# Patient Record
Sex: Female | Born: 1975 | Hispanic: No | Marital: Single | Smoking: Never smoker
Health system: Southern US, Community
[De-identification: ages and names within clinical notes are randomized; demographics above are authoritative.]

## PROBLEM LIST (undated history)

## (undated) DIAGNOSIS — M810 Age-related osteoporosis without current pathological fracture: Secondary | ICD-10-CM

---

## 1999-12-02 ENCOUNTER — Emergency Department (HOSPITAL_COMMUNITY): Admission: EM | Admit: 1999-12-02 | Discharge: 1999-12-02 | Payer: Self-pay | Admitting: Emergency Medicine

## 1999-12-02 ENCOUNTER — Encounter: Payer: Self-pay | Admitting: Emergency Medicine

## 2000-02-13 ENCOUNTER — Encounter: Payer: Self-pay | Admitting: Emergency Medicine

## 2000-02-13 ENCOUNTER — Emergency Department (HOSPITAL_COMMUNITY): Admission: EM | Admit: 2000-02-13 | Discharge: 2000-02-13 | Payer: Self-pay | Admitting: Emergency Medicine

## 2001-05-05 ENCOUNTER — Encounter: Payer: Self-pay | Admitting: Emergency Medicine

## 2001-05-05 ENCOUNTER — Emergency Department (HOSPITAL_COMMUNITY): Admission: EM | Admit: 2001-05-05 | Discharge: 2001-05-05 | Payer: Self-pay | Admitting: Emergency Medicine

## 2002-04-18 ENCOUNTER — Ambulatory Visit (HOSPITAL_COMMUNITY): Admission: RE | Admit: 2002-04-18 | Discharge: 2002-04-18 | Payer: Self-pay | Admitting: Family Medicine

## 2002-04-18 ENCOUNTER — Encounter: Payer: Self-pay | Admitting: Family Medicine

## 2002-06-07 ENCOUNTER — Encounter: Payer: Self-pay | Admitting: Family Medicine

## 2002-06-07 ENCOUNTER — Encounter: Admission: RE | Admit: 2002-06-07 | Discharge: 2002-06-07 | Payer: Self-pay | Admitting: Family Medicine

## 2004-07-19 ENCOUNTER — Emergency Department (HOSPITAL_COMMUNITY): Admission: EM | Admit: 2004-07-19 | Discharge: 2004-07-19 | Payer: Self-pay | Admitting: Emergency Medicine

## 2006-06-16 ENCOUNTER — Encounter: Admission: RE | Admit: 2006-06-16 | Discharge: 2006-06-16 | Payer: Self-pay | Admitting: Family Medicine

## 2008-05-08 ENCOUNTER — Emergency Department (HOSPITAL_COMMUNITY): Admission: EM | Admit: 2008-05-08 | Discharge: 2008-05-08 | Payer: Self-pay | Admitting: Emergency Medicine

## 2008-06-20 ENCOUNTER — Encounter: Admission: RE | Admit: 2008-06-20 | Discharge: 2008-06-20 | Payer: Self-pay | Admitting: Family Medicine

## 2010-07-02 ENCOUNTER — Other Ambulatory Visit: Payer: Self-pay | Admitting: Family Medicine

## 2010-07-08 ENCOUNTER — Ambulatory Visit
Admission: RE | Admit: 2010-07-08 | Discharge: 2010-07-08 | Disposition: A | Discharge status: Home / Self Care | Payer: Medicaid Other | Source: Ambulatory Visit | Attending: Family Medicine | Admitting: Family Medicine

## 2012-02-11 ENCOUNTER — Emergency Department (INDEPENDENT_AMBULATORY_CARE_PROVIDER_SITE_OTHER)
Admission: EM | Admit: 2012-02-11 | Discharge: 2012-02-11 | Disposition: A | Discharge status: Home / Self Care | Payer: Medicaid Other | Source: Home / Self Care | Attending: Emergency Medicine | Admitting: Emergency Medicine

## 2012-02-11 ENCOUNTER — Emergency Department (INDEPENDENT_AMBULATORY_CARE_PROVIDER_SITE_OTHER): Payer: Medicaid Other

## 2012-02-11 ENCOUNTER — Encounter (HOSPITAL_COMMUNITY): Payer: Self-pay | Admitting: Emergency Medicine

## 2012-02-11 DIAGNOSIS — R05 Cough: Secondary | ICD-10-CM

## 2012-02-11 DIAGNOSIS — R059 Cough, unspecified: Secondary | ICD-10-CM

## 2012-02-11 HISTORY — DX: Age-related osteoporosis without current pathological fracture: M81.0

## 2012-02-11 MED ORDER — FEXOFENADINE HCL 60 MG PO TABS: 60.0000 mg | ORAL_TABLET | Freq: Two times a day (BID) | ORAL | Status: AC

## 2012-02-11 NOTE — ED Notes (Signed)
Pt caregiver states that pt has had a productive cough and congestion x 1 wk. Denies fever, n/v/d. Pt has tried otc meds with no relief.

## 2012-02-11 NOTE — ED Provider Notes (Addendum)
History     CSN: [id number]  Arrival date & time 02/11/12  1627   First MD Initiated Contact with Patient 02/11/12 1743      Chief Complaint  Patient presents with  . Cough    productive cough and congestion x1 wk. denies fever, n/v/d.     (Consider location/radiation/quality/duration/timing/severity/associated sxs/prior treatment) HPI Comments: Patient is brought in by caregiver from a group home care and she's been congested and coughing for about a week. Sometimes she is able to bring something up but that's rare she's been coughing and having a lot of upper congestion as in nasal sound when she breathes ( caregiver describes). No vomiting, or diarrhea as. They have been given her some Mucinex and also some Robitussin-DM with no obvious relief of her cough. No tactile fevers  Patient is a 36 y.o. female presenting with cough. The history is provided by the patient and the nursing home.  Cough This is a new problem. The current episode started more than 2 days ago. The problem has been gradually improving. The cough is productive of sputum. Associated symptoms include rhinorrhea. Pertinent negatives include no chest pain, no chills, no sweats, no ear congestion, no shortness of breath, no wheezing and no eye redness. She has tried decongestants for the symptoms. The treatment provided no relief. Her past medical history does not include asthma.    Past Medical History  Diagnosis Date  . Osteoporosis     History reviewed. No pertinent past surgical history.  History reviewed. No pertinent family history.  History  Substance Use Topics  . Smoking status: Never Smoker   . Smokeless tobacco: Not on file  . Alcohol Use: No    OB History    Grav Para Term Preterm Abortions TAB SAB Ect Mult Living                  Review of Systems  Constitutional: Negative for fever, chills, activity change and fatigue.  HENT: Positive for rhinorrhea.   Eyes: Negative for redness.    Respiratory: Positive for cough. Negative for shortness of breath and wheezing.   Cardiovascular: Negative for chest pain.  Gastrointestinal: Negative for nausea, vomiting and diarrhea.  Skin: Negative for rash.  Neurological: Negative for dizziness.    Allergies  Review of patient's allergies indicates no known allergies.  Home Medications   Current Outpatient Rx  Name  Route  Sig  Dispense  Refill  . TUMS E-X PO   Oral   Take by mouth.         . CHLORHEXIDINE GLUCONATE 0.12 % MT SOLN   Mouth/Throat   Use as directed 15 mLs in the mouth or throat 2 (two) times daily.         Marland Kitchen VITAMIN D 1000 UNITS PO TABS   Oral   Take 1,000 Units by mouth daily.         Marland Kitchen FEXOFENADINE HCL 60 MG PO TABS   Oral   Take 1 tablet (60 mg total) by mouth 2 (two) times daily.   14 tablet   0     BP 159/93  Pulse 126  Temp 99.3 F (37.4 C) (Oral)  Resp 22  SpO2 98%  LMP 02/09/2012  Physical Exam  Nursing note and vitals reviewed. Constitutional: She appears well-developed and well-nourished. No distress.  HENT:  Head: Normocephalic.  Right Ear: Tympanic membrane normal.  Left Ear: Tympanic membrane normal.  Nose: Mucosal edema and rhinorrhea present.  Mouth/Throat: Uvula  is midline. No oropharyngeal exudate, posterior oropharyngeal edema, posterior oropharyngeal erythema or tonsillar abscesses.  Eyes: Conjunctivae normal are normal. No scleral icterus.  Neck: Neck supple. No JVD present.  Cardiovascular: Exam reveals no gallop and no friction rub.   No murmur heard.      Apical pulse 105 beats per minute  Pulmonary/Chest: Effort normal and breath sounds normal. No respiratory distress. She has no decreased breath sounds. She has no wheezes. She has no rhonchi. She has no rales. She exhibits no tenderness.  Lymphadenopathy:    She has no cervical adenopathy.  Skin: No rash noted. No erythema.    ED Course  Procedures (including critical care time)  Labs Reviewed - No  data to display Dg Chest 2 View  02/11/2012  *RADIOLOGY REPORT*  Clinical Data: Cough and congestion.  CHEST - 2 VIEW  Comparison: None.  Findings: The lungs are clear without focal consolidation, edema, effusion or pneumothorax.  Cardiopericardial silhouette is within normal limits for size.  Imaged bony structures of the thorax are intact.  IMPRESSION: Normal exam.   Original Report Authenticated By: Kennith Center, M.D.      1. Cough       MDM  Mild URI Sx's- normal x-rays. Recommended Allegra course of 2 weeks as patient has predominantly upper respiratory congestion. Patient noted tachycardic on her initial arrival. During exam wasn't noted to be lower to 105- 110.      Jimmie Molly, MD 02/11/12 Rickey Primus  Jimmie Molly, MD 02/11/12 [phone]

## 2015-07-26 ENCOUNTER — Other Ambulatory Visit: Payer: Self-pay | Admitting: Physician Assistant

## 2015-07-26 DIAGNOSIS — Z1231 Encounter for screening mammogram for malignant neoplasm of breast: Secondary | ICD-10-CM

## 2016-08-20 ENCOUNTER — Ambulatory Visit: Payer: Medicaid Other

## 2016-09-14 ENCOUNTER — Ambulatory Visit
Admission: RE | Admit: 2016-09-14 | Discharge: 2016-09-14 | Disposition: A | Discharge status: Home / Self Care | Payer: Medicaid Other | Source: Ambulatory Visit | Attending: Physician Assistant | Admitting: Physician Assistant

## 2016-09-14 DIAGNOSIS — Z1231 Encounter for screening mammogram for malignant neoplasm of breast: Secondary | ICD-10-CM

## 2018-05-27 ENCOUNTER — Emergency Department (HOSPITAL_COMMUNITY): Payer: Medicaid Other

## 2018-05-27 ENCOUNTER — Emergency Department (HOSPITAL_COMMUNITY)
Admission: EM | Admit: 2018-05-27 | Discharge: 2018-05-27 | Disposition: A | Discharge status: Home / Self Care | Payer: Medicaid Other | Attending: Emergency Medicine | Admitting: Emergency Medicine

## 2018-05-27 ENCOUNTER — Encounter (HOSPITAL_COMMUNITY): Payer: Self-pay | Admitting: *Deleted

## 2018-05-27 ENCOUNTER — Encounter (HOSPITAL_COMMUNITY)
Admission: EM | Disposition: A | Discharge status: Home / Self Care | Payer: Self-pay | Source: Home / Self Care | Attending: Emergency Medicine

## 2018-05-27 ENCOUNTER — Other Ambulatory Visit: Payer: Self-pay

## 2018-05-27 DIAGNOSIS — X58XXXA Exposure to other specified factors, initial encounter: Secondary | ICD-10-CM | POA: Insufficient documentation

## 2018-05-27 DIAGNOSIS — Z79899 Other long term (current) drug therapy: Secondary | ICD-10-CM | POA: Diagnosis not present

## 2018-05-27 DIAGNOSIS — Y999 Unspecified external cause status: Secondary | ICD-10-CM | POA: Insufficient documentation

## 2018-05-27 DIAGNOSIS — Y929 Unspecified place or not applicable: Secondary | ICD-10-CM | POA: Insufficient documentation

## 2018-05-27 DIAGNOSIS — S63287A Dislocation of proximal interphalangeal joint of left little finger, initial encounter: Secondary | ICD-10-CM | POA: Diagnosis not present

## 2018-05-27 DIAGNOSIS — S61207A Unspecified open wound of left little finger without damage to nail, initial encounter: Secondary | ICD-10-CM | POA: Diagnosis not present

## 2018-05-27 DIAGNOSIS — Y939 Activity, unspecified: Secondary | ICD-10-CM | POA: Insufficient documentation

## 2018-05-27 DIAGNOSIS — S6992XA Unspecified injury of left wrist, hand and finger(s), initial encounter: Secondary | ICD-10-CM | POA: Diagnosis present

## 2018-05-27 DIAGNOSIS — S63259A Unspecified dislocation of unspecified finger, initial encounter: Secondary | ICD-10-CM

## 2018-05-27 DIAGNOSIS — F79 Unspecified intellectual disabilities: Secondary | ICD-10-CM | POA: Diagnosis not present

## 2018-05-27 SURGERY — IRRIGATION AND DEBRIDEMENT EXTREMITY
Anesthesia: General | Laterality: Left

## 2018-05-27 MED ORDER — CEPHALEXIN 500 MG PO CAPS: 500.0000 mg | ORAL_CAPSULE | Freq: Four times a day (QID) | ORAL | 0 refills | Status: AC

## 2018-05-27 MED ORDER — CEFAZOLIN SODIUM-DEXTROSE 1-4 GM/50ML-% IV SOLN
1.0000 g | Freq: Once | INTRAVENOUS | Status: AC
  Administered 2018-05-27: 1 g via INTRAVENOUS
  Filled 2018-05-27: qty 50

## 2018-05-27 MED ORDER — LIDOCAINE HCL (PF) 1 % IJ SOLN
30.0000 mL | Freq: Once | INTRAMUSCULAR | Status: AC
  Administered 2018-05-27: 30 mL
  Filled 2018-05-27: qty 30

## 2018-05-27 NOTE — H&P (Signed)
Reason for Consult: Left small finger PIP dislocation Referring Physician: Tilden Fossa, MD  Kristina Olsen is an 43 y.o. female.  HPI: Patient is a 43 year old female with a New presented the ER today for evaluation of her left small finger.  Patient herself is unable to give a history.  Her caregiver is with her today in the ER and providing history.  She states that yesterday they noticed a laceration on the volar aspect of the digit.  They were seen at urgent care and then subsequently sent to the ER today.  Radiographs obtained in the ER showed a dorsally displaced PIP dislocation without fracture.  Attempted reduction was performed by the ER and hand surgery was consulted after unsuccessful reduction.  Per the caregiver and guardian who I spoke with on the phone, the patient's left ring finger was minimally functional and rather stiff at baseline.  She has minimal use of it and keeps it sticking out at most points.  Past Medical History:  Diagnosis Date  . Osteoporosis     History reviewed. No pertinent surgical history.  History reviewed. No pertinent family history.  Social History:  reports that she has never smoked. She has never used smokeless tobacco. She reports that she does not drink alcohol or use drugs.  Allergies: No Known Allergies  Medications: I have reviewed the patient's current medications.  No results found for this or any previous visit (from the past 48 hour(s)).  Dg Hand Complete Left  Result Date: 05/27/2018 CLINICAL DATA:  Post reduction fifth finger. EXAM: LEFT HAND - COMPLETE 3+ VIEW COMPARISON:  Plain film from earlier same day. FINDINGS: Persistent posterior dislocation of the middle phalanx of the fifth digit. Slight overriding again noted at the dislocation. No fracture line or displaced fracture fragment seen. IMPRESSION: Persistent posterior dislocation of the middle phalanx. Electronically Signed   By: Bary Richard M.D.   On: 05/27/2018 16:57   Dg Hand  Complete Left  Result Date: 05/27/2018 CLINICAL DATA:  Left pinky dislocation, laceration to anterior side of left pinky. EXAM: LEFT HAND - COMPLETE 3+ VIEW COMPARISON:  None. FINDINGS: Fifth middle phalanx is posteriorly dislocated relative to the proximal phalanx, with slight overriding. No fracture line or displaced fracture fragment appreciated. Remainder of the osseous structures of the LEFT hand appear intact and normally aligned. IMPRESSION: Posterior dislocation of the fifth middle phalanx relative to the proximal phalanx, with slight overriding. Electronically Signed   By: Bary Richard M.D.   On: 05/27/2018 14:47    Review of Systems  Musculoskeletal: Positive for joint pain.  All other systems reviewed and are negative.  Blood pressure (!) 140/91, pulse (!) 121, temperature 98.3 F (36.8 C), temperature source Oral, resp. rate 16, height 5' (1.524 m), SpO2 100 %.  Awake and alert. Not oriented and grossly nonverbal resp nonlabored rrr Left upper extremity: Examination of the left hand shows a small 1 cm volar laceration over the PIP joint.  There is serosanguineous drainage from the wound.  There is no pus.  The finger is moderately swollen.  She is unable to flex the DIP PIP or MP joints.  There are all held in full extension.  A digital block was performed previously by the emergency department so sensation was unable to be evaluated.  The fingertip though is warm and well-perfused with brisk capillary refill.   Assessment/Plan: 1. Open simple left PIP dorsal dislocation  I had a discussion with the patient's caregiver who is here with her  today.  I did recommend close reduction attempt again in the ER.  This was accomplished and repeat radiographs were obtained showing concentric reduction of the PIP joint.  We will close her wound and applied a splint.  She can follow-up with me next week for continued follow-up.  We will send her home with oral antibiotics secondary to her open  wound.  Procedure note: Verbal consent was obtained for left small finger PIP reduction from the patient's caregiver.  After this a gentle hyperextension movement was performed through the PIP joint followed by gentle traction followed by flexion.  I could feel the joint reducing and it was relatively stable there following it.  Radiographs were obtained which showed a concentric reduction.  Following this the wound volarly was cleaned with iodine and alcohol.  2 interrupted 4-0 Prolene sutures were then placed.  The wound was dressed with here dressing, 4 x 4's, Kling and a dorsal blocking splint with Coban.  She tolerated this well and there were no complications.  Cain SaupeJames J Nevyn Bossman III 05/27/2018, 6:09 PM

## 2018-05-27 NOTE — Discharge Instructions (Addendum)
You have been seen today for injury to finger. Please read and follow all provided instructions.   1. Medications: keflex (antibiotics), usual home medications 2. Treatment: rest, drink plenty of fluids 3. Follow Up: Please follow up with hand surgery in 1 week. Please follow up with your primary doctor in 2 days for discussion of your diagnoses and further evaluation after today's visit; if you do not have a primary care doctor use the resource guide provided to find one; Please return to the ER for any new or worsening symptoms. Please obtain all of your results from medical records or have your doctors office obtain the results - share them with your doctor - you should be seen at your doctors office. Call today to arrange your follow up.   Take medications as prescribed. Please review all of the medicines and only take them if you do not have an allergy to them. Return to the emergency room for worsening condition or new concerning symptoms. Follow up with your regular doctor. If you don't have a regular doctor use one of the numbers below to establish a primary care doctor.  Please be aware that if you are taking birth control pills, taking other prescriptions, ESPECIALLY ANTIBIOTICS may make the birth control ineffective - if this is the case, either do not engage in sexual activity or use alternative methods of birth control such as condoms until you have finished the medicine and your family doctor says it is OK to restart them. If you are on a blood thinner such as COUMADIN, be aware that any other medicine that you take may cause the coumadin to either work too much, or not enough - you should have your coumadin level rechecked in next 7 days if this is the case.  ?  It is also a possibility that you have an allergic reaction to any of the medicines that you have been prescribed - Everybody reacts differently to medications and while MOST people have no trouble with most medicines, you may have a  reaction such as nausea, vomiting, rash, swelling, shortness of breath. If this is the case, please stop taking the medicine immediately and contact your physician.  ?  You should return to the ER if you develop severe or worsening symptoms.   Emergency Department Resource Guide 1) Find a Doctor and Pay Out of Pocket Although you won't have to find out who is covered by your insurance plan, it is a good idea to ask around and get recommendations. You will then need to call the office and see if the doctor you have chosen will accept you as a new patient and what types of options they offer for patients who are self-pay. Some doctors offer discounts or will set up payment plans for their patients who do not have insurance, but you will need to ask so you aren't surprised when you get to your appointment.  2) Contact Your Local Health Department Not all health departments have doctors that can see patients for sick visits, but many do, so it is worth a call to see if yours does. If you don't know where your local health department is, you can check in your phone book. The CDC also has a tool to help you locate your state's health department, and many state websites also have listings of all of their local health departments.  3) Find a Walk-in Clinic If your illness is not likely to be very severe or complicated, you may want  to try a walk in clinic. These are popping up all over the country in pharmacies, drugstores, and shopping centers. They're usually staffed by nurse practitioners or physician assistants that have been trained to treat common illnesses and complaints. They're usually fairly quick and inexpensive. However, if you have serious medical issues or chronic medical problems, these are probably not your best option.  No Primary Care Doctor: Call [facility] Connect at  [phone] - they can help you locate a primary care doctor that  accepts your insurance, provides certain services,  etc. Physician Referral Service- [phone]  Chronic Pain Problems: Organization         Address  Phone   Notes  [facility]  [phone] Patients need to be referred by their primary care doctor.   Medication Assistance: Organization         Address  Phone   Notes  [facility] Medication [facility] [address] [place], [place] [address] [phone] --Must be a resident of [facility] -- Must have NO insurance coverage whatsoever (no Medicaid/ Medicare, etc.) -- The pt. MUST have a primary care doctor that directs their care regularly and follows them in the community   [facility]  [phone]   [employer]  [phone]    Agencies that provide inexpensive medical care: Organization         Address  Phone   Notes  [place]  [phone]   [facility]    [phone]   [facility] [address], [place] [address] [phone]   [place] [address], [place] [phone]   [facility]    [phone]   [facility]    [phone]   [place] [address], [place] Phone:  [phone], Fax:  [phone] Hours of Operation:  9 am - 6 pm, M-F.  Also accepts Medicaid/Medicare and self-pay.  [facility] for [place] [address], [address], [place] Phone: [phone], Fax: [phone]. Hours of Operation:  8:30 am - 5:30 pm, M-F.  Also accepts Medicaid and self-pay.  [facility] [place] [address] [place] Phone: [phone]   [place], [place], [place] [phone], Ext. [phone] Mondays & Thursdays: 7-9 AM.  First 15 patients are seen on a first come, first serve basis.    [place] Providers:  Organization         Address  Phone   Notes  [facility] [address], [place] [phone] Also accepts self-pay patients.  [facility] [address], [place]  [phone]   [place], [address], [place] [phone]   [facility] [address], [place] [phone]   Lucianne Lei [address], [place]   [phone] Only accepts [place] patients after they have their name applied to their card.   Self-Pay (no insurance) in [facility]:  Organization         Address  Phone   Notes  Sickle Cell Patients, [facility] [place] [phone]   [facility] [place] [phone]  [facility] [place]  [address], [place] [phone]   [facility]/Dr. Osei-Bonsu  [address] [place] or [address], [place] [phone] Phone number for both [place] and [employer] locations is the same.  [facility] [address], [place] [phone]   [facility] [address] [place] or [address] [phone]   [facility] [address], [place] [phone], phone; [phone], fax Sees patients 1st and 3rd Saturday of every month.  Must not qualify for public or private insurance (i.e. Medicaid, Medicare, [employer], Veterans' Benefits)  Household income should be no more than 200% of the poverty level The clinic cannot treat you if you are pregnant or think you are pregnant  Sexually transmitted diseases are not treated at the clinic.

## 2018-05-27 NOTE — ED Notes (Signed)
Pt back from X-ray.  

## 2018-05-27 NOTE — ED Notes (Signed)
Patient verbalizes understanding of discharge instructions . Opportunity for questions and answers were provided . Armband removed by staff ,Pt discharged from ED. W/C  offered at D/C  and Declined W/C at D/C and was escorted to lobby by RN.  

## 2018-05-27 NOTE — ED Provider Notes (Addendum)
[facility] EMERGENCY DEPARTMENT Provider Note   CSN: [id number] Arrival date & time: 05/27/18  1255    History   Chief Complaint Chief Complaint  Patient presents with  . Finger Injury    HPI Kristina Olsen is a 43 y.o. female with a PMH of osteoporosis, IBS, and intellectual disability presenting with a left small finger injury. Caregiver, Melanee Spry, is the historian. Caregiver reports patient is right handed dominant. Caregiver reports patient lives with her, but her legal guardian is Tymika. Caregiver states she noticed the wound approximately 24 hours ago. Caregiver states patient did not complain of discomfort until caregiver noticed the wound yesterday. Caregiver states she washed area with peroxide and applied neosporin. Caregiver states wound appears to be improving when compared to the day before. Caregiver reports erythema and edema over the wound. Caregiver denies known injury, but is unsure when wound occurred. Caregiver reports minimal bleeding and mild discharge. Caregiver denies a history of diabetes. Caregiver is unsure of last tetanus shot, but refuses any shots today and states she will follow up with her PCP. Caregiver denies fever, chills, nausea, vomiting, abdominal pain, cough, congestion, sick exposures, or recent travel. Patient was evaluated at the urgent care and advised to come to the ER for further evaluation.      HPI  Past Medical History:  Diagnosis Date  . Osteoporosis     There are no active problems to display for this patient.   History reviewed. No pertinent surgical history.   OB History   No obstetric history on file.    Home Medications    Prior to Admission medications   Medication Sig Start Date End Date Taking? Authorizing Provider  Calcium Carbonate Antacid (TUMS E-X PO) Take by mouth.    [provider]  chlorhexidine (PERIDEX) 0.12 % solution Use as directed 15 mLs in the mouth or throat 2 (two)  times daily.    [provider]  cholecalciferol (VITAMIN D) 1000 UNITS tablet Take 1,000 Units by mouth daily.    [provider]  fexofenadine (ALLEGRA) 60 MG tablet Take 1 tablet (60 mg total) by mouth 2 (two) times daily. 02/11/12 02/25/12  Jimmie Molly, MD    Family History History reviewed. No pertinent family history.  Social History Social History   Tobacco Use  . Smoking status: Never Smoker  . Smokeless tobacco: Never Used  Substance Use Topics  . Alcohol use: No  . Drug use: No     Allergies   Patient has no known allergies.   Review of Systems Review of Systems  Constitutional: Negative for chills, diaphoresis and fever.  Respiratory: Negative for cough and shortness of breath.   Cardiovascular: Negative for chest pain.  Gastrointestinal: Negative for abdominal pain, nausea and vomiting.  Musculoskeletal: Positive for arthralgias and joint swelling.  Skin: Positive for color change and wound. Negative for rash.  Allergic/Immunologic: Negative for immunocompromised state.  Neurological: Negative for weakness and numbness.  Hematological: Negative for adenopathy.    Physical Exam Updated Vital Signs BP (!) 140/91 (BP Location: Right Arm)   Pulse (!) 121   Temp 98.3 F (36.8 C) (Oral)   Resp 16   Ht 5' (1.524 m)   SpO2 100%   Physical Exam Vitals signs and nursing note reviewed.  Constitutional:      General: She is not in acute distress.    Appearance: She is well-developed. She is not diaphoretic.  HENT:     Head: Normocephalic and  atraumatic.  Neck:     Musculoskeletal: Normal range of motion.  Cardiovascular:     Rate and Rhythm: Regular rhythm. Tachycardia present.     Heart sounds: Normal heart sounds. No murmur. No friction rub. No gallop.   Pulmonary:     Effort: Pulmonary effort is normal. No respiratory distress.     Breath sounds: Normal breath sounds. No wheezing or rales.  Abdominal:     Palpations: Abdomen is soft.      Tenderness: There is no abdominal tenderness.  Musculoskeletal:     Left wrist: Normal. She exhibits normal range of motion, no tenderness and no bony tenderness.     Right hand: Normal. She exhibits normal range of motion, no tenderness and no bony tenderness. Normal sensation noted. Normal strength noted.     Left hand: She exhibits decreased range of motion, tenderness and swelling. Normal sensation noted. Decreased strength noted.       Hands:  Skin:    General: Skin is warm.     Findings: Erythema and wound present. No rash.  Neurological:     Mental Status: She is alert.        ED Treatments / Results  Labs (all labs ordered are listed, but only abnormal results are displayed) Labs Reviewed - No data to display  EKG None  Radiology Dg Hand Complete Left  Result Date: 05/27/2018 CLINICAL DATA:  Left pinky dislocation, laceration to anterior side of left pinky. EXAM: LEFT HAND - COMPLETE 3+ VIEW COMPARISON:  None. FINDINGS: Fifth middle phalanx is posteriorly dislocated relative to the proximal phalanx, with slight overriding. No fracture line or displaced fracture fragment appreciated. Remainder of the osseous structures of the LEFT hand appear intact and normally aligned. IMPRESSION: Posterior dislocation of the fifth middle phalanx relative to the proximal phalanx, with slight overriding. Electronically Signed   By: Bary RichardStan  Maynard M.D.   On: 05/27/2018 14:47    Procedures .Ortho Injury Treatment Date/Time: 05/28/2018 8:00 AM Performed by: Leretha DykesHernandez, Marcee Jacobs P, PA-C Authorized by: Leretha DykesHernandez, Shavawn Stobaugh P, PA-C   Consent:    Consent obtained:  Verbal   Consent given by:  Guardian   Risks discussed:  Recurrent dislocation, fracture and nerve damage (Dislocation)   Alternatives discussed:  No treatmentInjury location: finger Location details: left little finger Injury type: dislocation Pre-procedure neurovascular assessment: neurovascularly intact Pre-procedure distal  perfusion: normal Pre-procedure neurological function: normal Pre-procedure range of motion: reduced Anesthesia: digital block  Anesthesia: Local anesthesia used: yes Local Anesthetic: lidocaine 1% without epinephrine  Patient sedated: NoManipulation performed: yes Reduction successful: no Post-procedure neurovascular assessment: post-procedure neurovascularly intact Post-procedure distal perfusion: normal Post-procedure neurological function: normal Post-procedure range of motion: normal Patient tolerance: Patient tolerated the procedure well with no immediate complications    (including critical care time)  Medications Ordered in ED Medications  ceFAZolin (ANCEF) IVPB 1 g/50 mL premix (1 g Intravenous New Bag/Given 05/27/18 1617)  lidocaine (PF) (XYLOCAINE) 1 % injection 30 mL (30 mLs Other Given 05/27/18 1446)     Initial Impression / Assessment and Plan / ED Course  I have reviewed the triage vital signs and the nursing notes.  Pertinent labs & imaging results that were available during my care of the patient were reviewed by me and considered in my medical decision making (see chart for details).  Clinical Course as of May 27 1627  Sat May 27, 2018  1453 Posterior dislocation of the fifth middle phalanx relative to the proximal phalanx, with slight overriding.  DG Hand Complete Left [AH]    Clinical Course User Index [AH] Leretha Dykes, PA-C       Patient presents with a wound on her left small finger. X ray reveals posterior dislocation of the fifth middle phalanx. Wound has surrounding erythema and minimal discharge consistent with an infection. Patient has significant edema and limited ROM of finger. Consulted hand surgery. Hand surgery, Dr. Roney Mans, recommends reducing finger, antibiotics, and closure of wound. Hand surgery recommends cefazolin in the ER and discharging patient on keflex. Dr. Roney Mans recommends follow up in 1 week.    Attempted to reduce  finger with Dr. Madilyn Hook. Wound closure was not successful due to tension and edema of wound. Post reduction x ray is pending. At shift change care was transferred to Dr. Madilyn Hook who will follow pending studies, re-evaluate and determine disposition.   Findings and plan of care discussed with supervising physician Dr. Madilyn Hook who personally evaluated patient.    Final Clinical Impressions(s) / ED Diagnoses   Final diagnoses:  Injury of finger of left hand, initial encounter  Dislocation of finger, initial encounter    ED Discharge Orders    None       Leretha Dykes, PA-C 05/27/18 1629    Leretha Dykes, [place] 05/28/18 0803    Tilden Fossa, MD 05/28/18 1213

## 2018-05-27 NOTE — ED Notes (Signed)
Patient transported to X-ray 

## 2018-05-27 NOTE — ED Triage Notes (Signed)
PT cut Lt small finger  ,unknown when lac occurred. Pt went to [facility] and was sent to ED  For eval.

## 2018-08-22 ENCOUNTER — Other Ambulatory Visit: Payer: Self-pay | Admitting: Family Medicine

## 2018-08-22 DIAGNOSIS — Z1231 Encounter for screening mammogram for malignant neoplasm of breast: Secondary | ICD-10-CM

## 2018-10-13 ENCOUNTER — Other Ambulatory Visit: Payer: Self-pay

## 2018-10-13 ENCOUNTER — Ambulatory Visit
Admission: RE | Admit: 2018-10-13 | Discharge: 2018-10-13 | Disposition: A | Discharge status: Home / Self Care | Payer: Medicaid Other | Source: Ambulatory Visit | Attending: Family Medicine | Admitting: Family Medicine

## 2018-10-13 DIAGNOSIS — Z1231 Encounter for screening mammogram for malignant neoplasm of breast: Secondary | ICD-10-CM

## 2019-09-04 ENCOUNTER — Other Ambulatory Visit: Payer: Self-pay | Admitting: Family Medicine

## 2019-09-04 DIAGNOSIS — Z1231 Encounter for screening mammogram for malignant neoplasm of breast: Secondary | ICD-10-CM

## 2019-10-15 ENCOUNTER — Ambulatory Visit: Payer: Medicaid Other

## 2019-11-02 ENCOUNTER — Ambulatory Visit
Admission: RE | Admit: 2019-11-02 | Discharge: 2019-11-02 | Disposition: A | Discharge status: Home / Self Care | Payer: Medicaid Other | Source: Ambulatory Visit | Attending: Family Medicine | Admitting: Family Medicine

## 2019-11-02 ENCOUNTER — Other Ambulatory Visit: Payer: Self-pay

## 2019-11-02 DIAGNOSIS — Z1231 Encounter for screening mammogram for malignant neoplasm of breast: Secondary | ICD-10-CM

## 2019-11-06 ENCOUNTER — Other Ambulatory Visit: Payer: Self-pay | Admitting: Family Medicine

## 2019-11-06 DIAGNOSIS — R928 Other abnormal and inconclusive findings on diagnostic imaging of breast: Secondary | ICD-10-CM

## 2019-11-12 ENCOUNTER — Other Ambulatory Visit: Payer: Self-pay

## 2019-11-12 ENCOUNTER — Ambulatory Visit
Admission: RE | Admit: 2019-11-12 | Discharge: 2019-11-12 | Disposition: A | Discharge status: Home / Self Care | Payer: Medicaid Other | Source: Ambulatory Visit | Attending: Family Medicine | Admitting: Family Medicine

## 2019-11-12 ENCOUNTER — Other Ambulatory Visit: Payer: Self-pay | Admitting: Family Medicine

## 2019-11-12 DIAGNOSIS — R928 Other abnormal and inconclusive findings on diagnostic imaging of breast: Secondary | ICD-10-CM

## 2019-11-12 DIAGNOSIS — R921 Mammographic calcification found on diagnostic imaging of breast: Secondary | ICD-10-CM

## 2020-05-28 ENCOUNTER — Ambulatory Visit
Admission: RE | Admit: 2020-05-28 | Discharge: 2020-05-28 | Disposition: A | Discharge status: Home / Self Care | Payer: Medicaid Other | Source: Ambulatory Visit | Attending: Family Medicine | Admitting: Family Medicine

## 2020-05-28 ENCOUNTER — Other Ambulatory Visit: Payer: Self-pay

## 2020-05-28 DIAGNOSIS — R921 Mammographic calcification found on diagnostic imaging of breast: Secondary | ICD-10-CM

## 2020-05-29 ENCOUNTER — Other Ambulatory Visit: Payer: Self-pay | Admitting: Family Medicine

## 2020-05-29 DIAGNOSIS — R928 Other abnormal and inconclusive findings on diagnostic imaging of breast: Secondary | ICD-10-CM

## 2020-06-03 ENCOUNTER — Other Ambulatory Visit: Payer: Self-pay | Admitting: Family Medicine

## 2020-06-03 DIAGNOSIS — R921 Mammographic calcification found on diagnostic imaging of breast: Secondary | ICD-10-CM

## 2020-11-28 ENCOUNTER — Other Ambulatory Visit: Payer: Self-pay

## 2020-11-28 ENCOUNTER — Ambulatory Visit
Admission: RE | Admit: 2020-11-28 | Discharge: 2020-11-28 | Disposition: A | Discharge status: Home / Self Care | Payer: Medicaid Other | Source: Ambulatory Visit | Attending: Family Medicine | Admitting: Family Medicine

## 2020-11-28 DIAGNOSIS — R921 Mammographic calcification found on diagnostic imaging of breast: Secondary | ICD-10-CM

## 2021-10-08 ENCOUNTER — Other Ambulatory Visit: Payer: Self-pay | Admitting: Family Medicine

## 2021-10-08 DIAGNOSIS — Z1231 Encounter for screening mammogram for malignant neoplasm of breast: Secondary | ICD-10-CM

## 2021-10-08 DIAGNOSIS — R921 Mammographic calcification found on diagnostic imaging of breast: Secondary | ICD-10-CM

## 2022-01-25 ENCOUNTER — Ambulatory Visit
Admission: RE | Admit: 2022-01-25 | Discharge: 2022-01-25 | Disposition: A | Discharge status: Home / Self Care | Payer: Medicaid Other | Source: Ambulatory Visit | Attending: Family Medicine | Admitting: Family Medicine

## 2022-01-25 ENCOUNTER — Other Ambulatory Visit: Payer: Self-pay | Admitting: Family Medicine

## 2022-01-25 DIAGNOSIS — R921 Mammographic calcification found on diagnostic imaging of breast: Secondary | ICD-10-CM

## 2022-01-25 DIAGNOSIS — N6320 Unspecified lump in the left breast, unspecified quadrant: Secondary | ICD-10-CM

## 2022-10-19 ENCOUNTER — Other Ambulatory Visit: Payer: Self-pay | Admitting: Adult Health Nurse Practitioner

## 2022-10-19 DIAGNOSIS — Z1231 Encounter for screening mammogram for malignant neoplasm of breast: Secondary | ICD-10-CM

## 2022-12-12 IMAGING — MG DIGITAL DIAGNOSTIC BILAT W/ TOMO W/ CAD
8 of 12 series · 8 of 28 positions shown · non-contrast
Comparison: Previous exam(s).

CLINICAL DATA: One year interval follow-up of likely benign
calcifications involving the LEFT breast. Annual evaluation, RIGHT
breast.

EXAM:
DIGITAL DIAGNOSTIC BILATERAL MAMMOGRAM WITH TOMOSYNTHESIS AND CAD
TECHNIQUE: Bilateral digital diagnostic mammography and breast tomosynthesis
was performed. The images were evaluated with computer-aided
detection.

[L CC (1 of 3)]
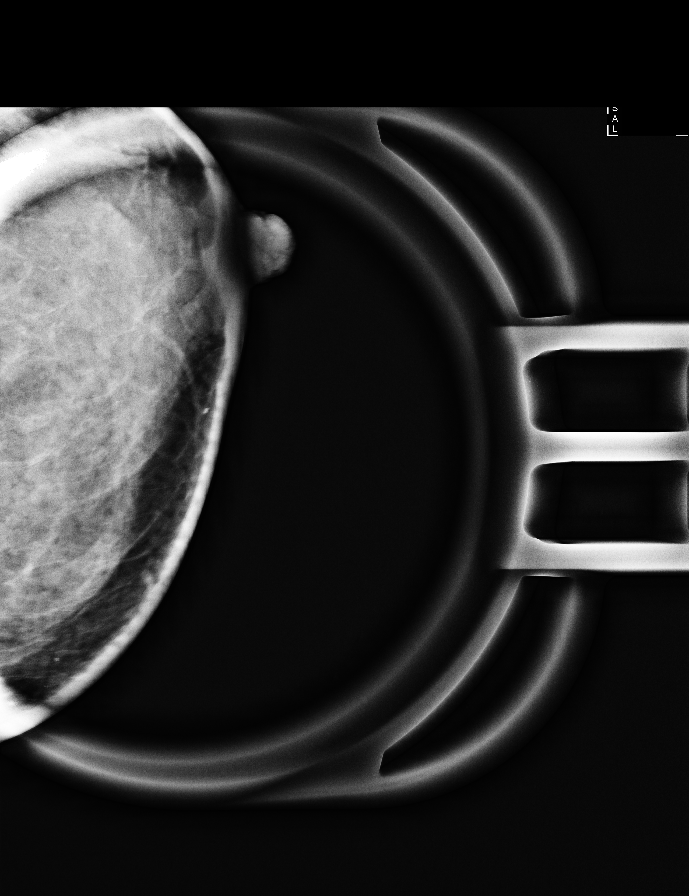

[L ML]
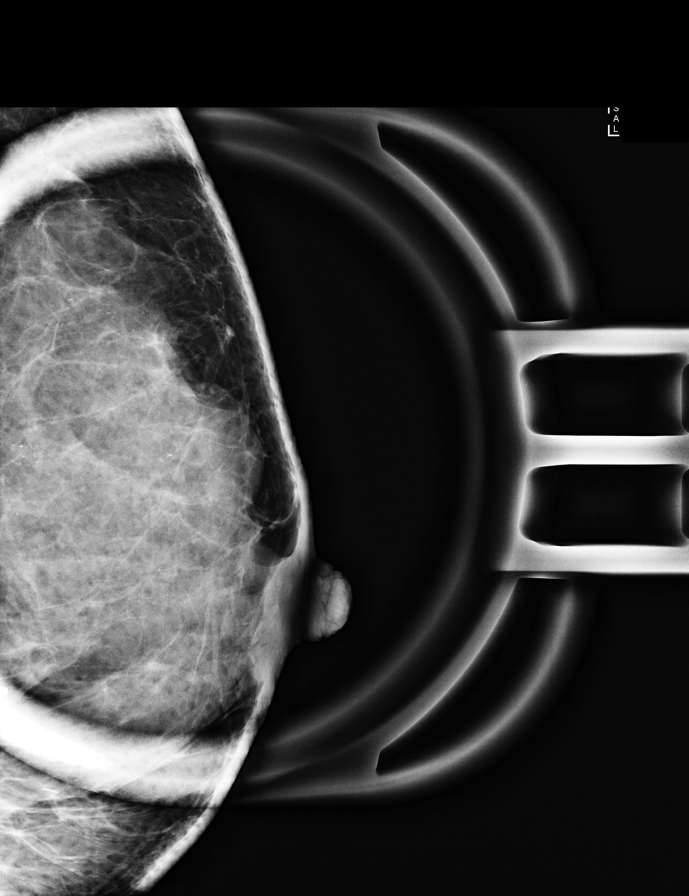

[L CC (2 of 3)]
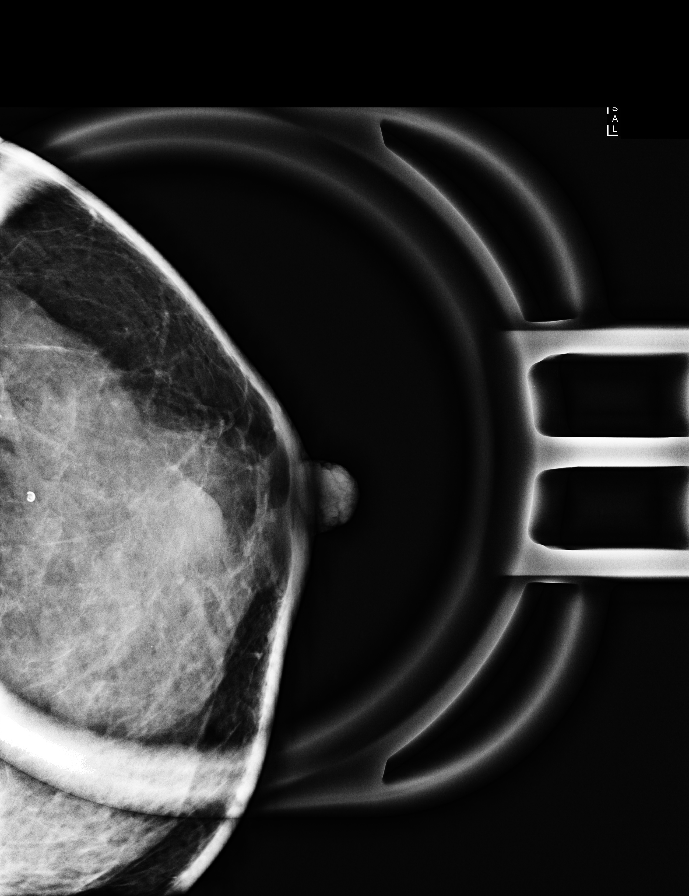

[L CC (3 of 3)]
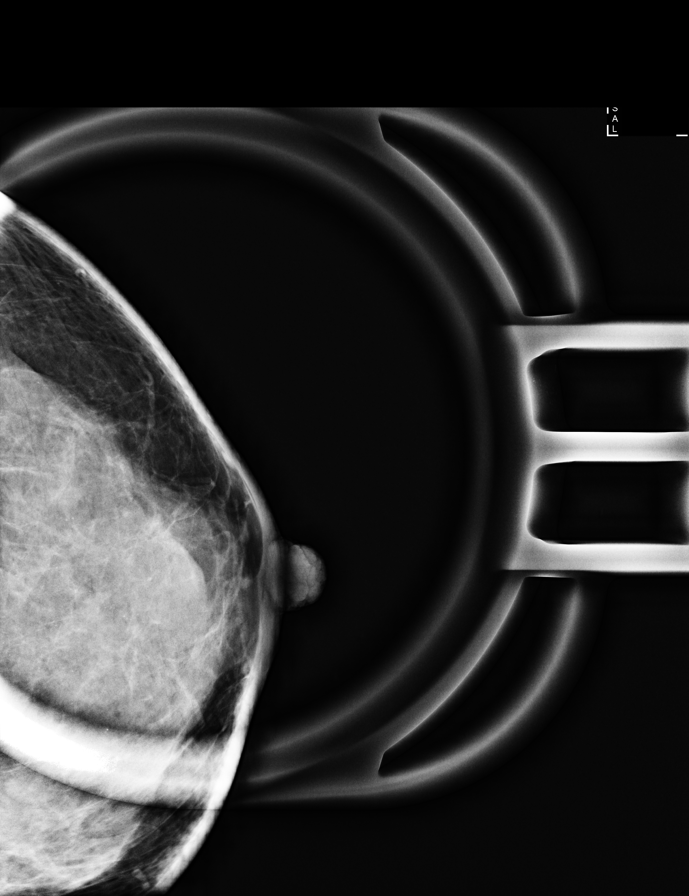

[R MLO synth-2D]
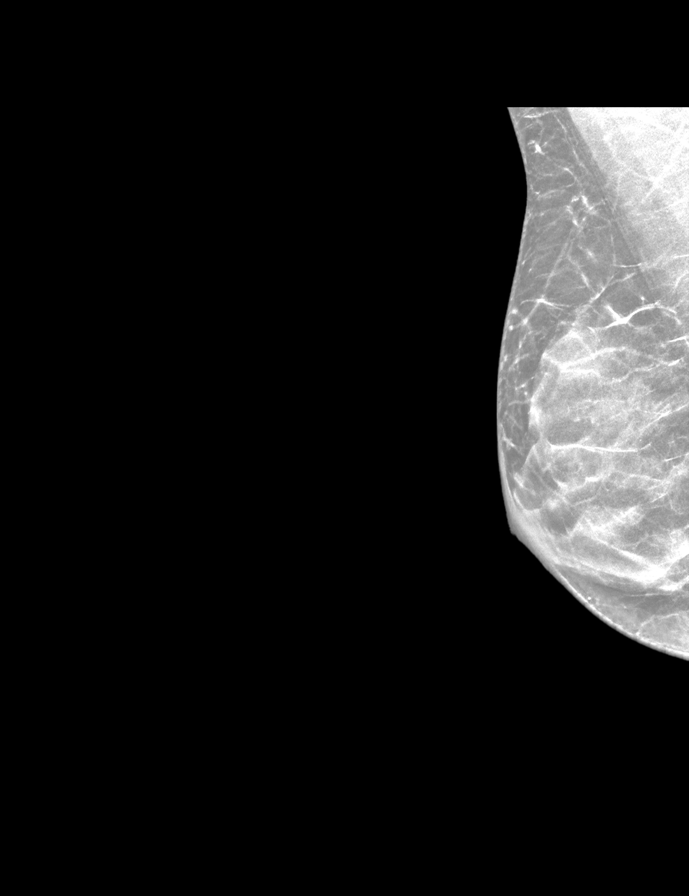

[R CC synth-2D]
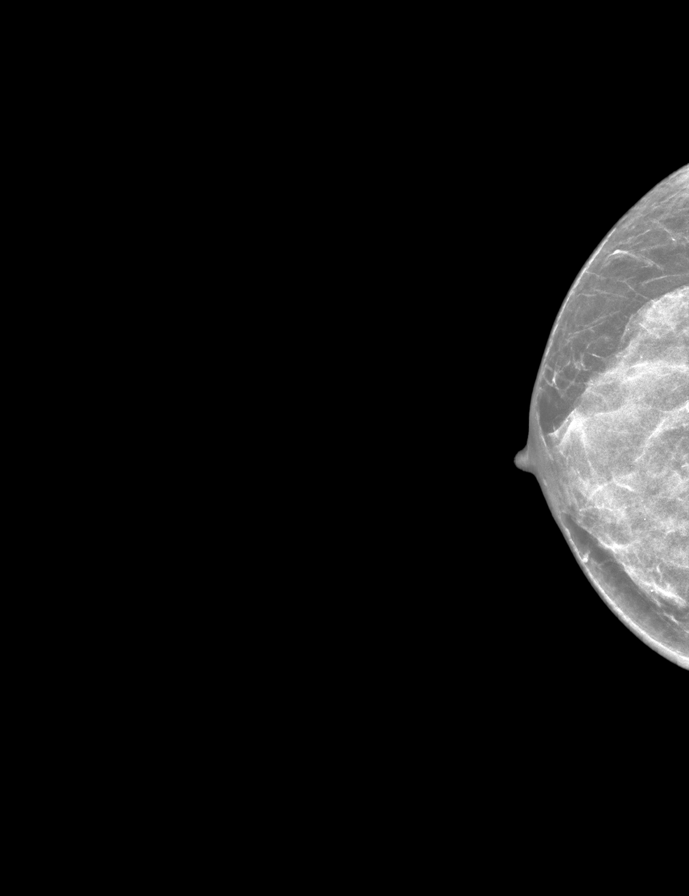

[L CC synth-2D]
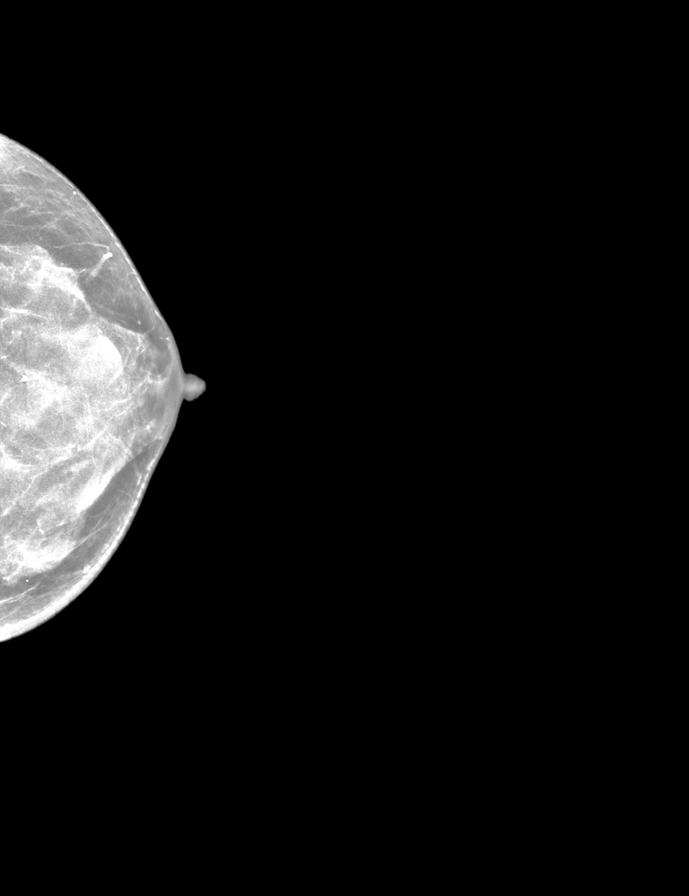

[L MLO synth-2D]
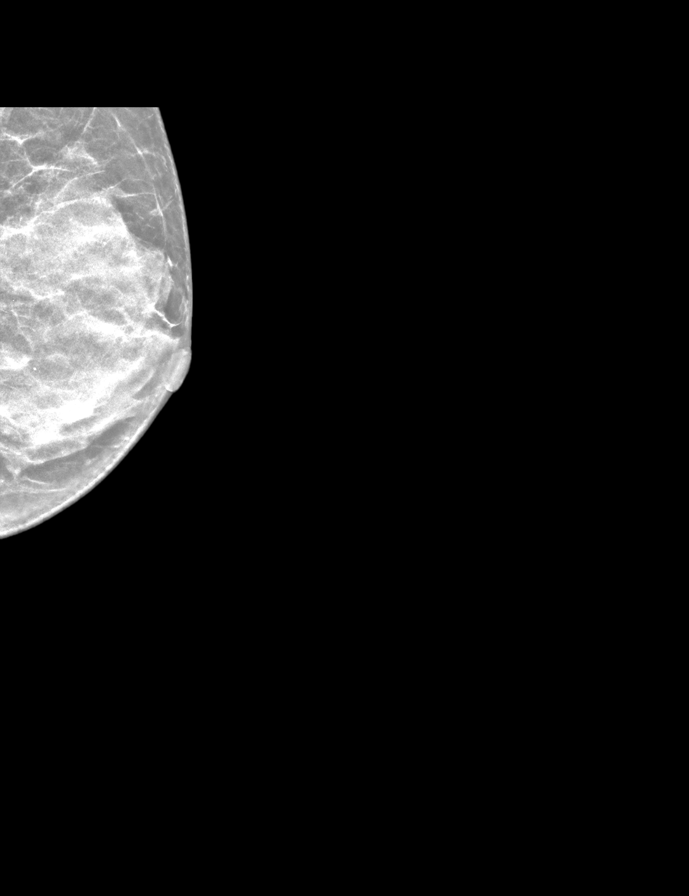

[8 of 28 positions shown; findings below may reference images not displayed]

ACR Breast Density Category d: The breast tissue is extremely dense,
which lowers the sensitivity of mammography.
FINDINGS: Full field CC and MLO views of both breasts and spot magnification
CC and mediolateral views of the LEFT breast calcifications were
obtained.

RIGHT: No findings suspicious for malignancy.

LEFT: The loosely grouped calcifications involving the UPPER OUTER
QUADRANT are unchanged dating back to October 2019. Many of the
calcifications demonstrate layering on mediolateral spot
magnification view. There are no new linear or branching forms.

No new or suspicious findings elsewhere.
IMPRESSION: 1. Stable likely benign calcifications involving the UPPER OUTER
QUADRANT of the LEFT breast, many of which are benign milk of
calcium.
2. No mammographic evidence of malignancy involving the RIGHT
breast.

RECOMMENDATION:
BILATERAL diagnostic mammography in 1 year to include spot
magnification views of the LEFT breast calcifications (in order to
confirm 2 years of stability).

I have discussed the findings and recommendations with the patient
and her caretaker. If applicable, a reminder letter will be sent to
the patient regarding the next appointment.

BI-RADS CATEGORY  3: Probably benign.

## 2023-01-27 ENCOUNTER — Ambulatory Visit
Admission: RE | Admit: 2023-01-27 | Discharge: 2023-01-27 | Disposition: A | Discharge status: Home / Self Care | Payer: MEDICAID | Source: Ambulatory Visit | Attending: Adult Health Nurse Practitioner | Admitting: Adult Health Nurse Practitioner

## 2023-01-27 DIAGNOSIS — Z1231 Encounter for screening mammogram for malignant neoplasm of breast: Secondary | ICD-10-CM

## 2023-12-23 ENCOUNTER — Other Ambulatory Visit: Payer: Self-pay | Admitting: Adult Health Nurse Practitioner

## 2023-12-23 DIAGNOSIS — Z1231 Encounter for screening mammogram for malignant neoplasm of breast: Secondary | ICD-10-CM

## 2024-01-30 ENCOUNTER — Ambulatory Visit: Payer: MEDICAID

## 2024-02-23 ENCOUNTER — Ambulatory Visit
Admission: RE | Admit: 2024-02-23 | Discharge: 2024-02-23 | Disposition: A | Discharge status: Home / Self Care | Payer: MEDICAID | Source: Ambulatory Visit | Attending: Adult Health Nurse Practitioner | Admitting: Adult Health Nurse Practitioner

## 2024-02-23 DIAGNOSIS — Z1231 Encounter for screening mammogram for malignant neoplasm of breast: Secondary | ICD-10-CM
